# Patient Record
Sex: Male | Born: 1995 | Hispanic: Yes | Marital: Single | Smoking: Never smoker
Health system: Southern US, Community
[De-identification: ages and names within clinical notes are randomized; demographics above are authoritative.]

## PROBLEM LIST (undated history)

## (undated) HISTORY — PX: APPENDECTOMY: SHX54

---
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
## 2014-02-11 ENCOUNTER — Emergency Department: Payer: Self-pay | Admitting: Emergency Medicine

## 2015-04-01 ENCOUNTER — Emergency Department
Admission: EM | Admit: 2015-04-01 | Discharge: 2015-04-01 | Disposition: A | Discharge status: Home / Self Care | Attending: Emergency Medicine | Admitting: Emergency Medicine

## 2015-04-01 ENCOUNTER — Encounter: Payer: Self-pay | Admitting: Emergency Medicine

## 2015-04-01 DIAGNOSIS — J029 Acute pharyngitis, unspecified: Secondary | ICD-10-CM | POA: Insufficient documentation

## 2015-04-01 DIAGNOSIS — H9209 Otalgia, unspecified ear: Secondary | ICD-10-CM | POA: Diagnosis not present

## 2015-04-01 LAB — POCT RAPID STREP A: STREPTOCOCCUS, GROUP A SCREEN (DIRECT): NEGATIVE

## 2015-04-01 MED ORDER — KETOROLAC TROMETHAMINE 30 MG/ML IJ SOLN
INTRAMUSCULAR | Status: AC
  Administered 2015-04-01: 30 mg via INTRAMUSCULAR
  Filled 2015-04-01: qty 1

## 2015-04-01 MED ORDER — DEXAMETHASONE SODIUM PHOSPHATE 10 MG/ML IJ SOLN
10.0000 mg | Freq: Once | INTRAMUSCULAR | Status: AC
  Administered 2015-04-01: 10 mg via INTRAMUSCULAR
  Filled 2015-04-01: qty 1

## 2015-04-01 MED ORDER — KETOROLAC TROMETHAMINE 30 MG/ML IJ SOLN
30.0000 mg | Freq: Once | INTRAMUSCULAR | Status: AC
  Administered 2015-04-01: 30 mg via INTRAMUSCULAR

## 2015-04-01 MED ORDER — IBUPROFEN 600 MG PO TABS: 600.0000 mg | ORAL_TABLET | Freq: Three times a day (TID) | ORAL | Status: DC | PRN

## 2015-04-01 NOTE — ED Provider Notes (Signed)
[facility] Emergency Department Provider Note   ____________________________________________  Time seen: 1630  I have reviewed the triage vital signs and the nursing notes.   HISTORY  Chief Complaint Otalgia and Sore Throat   History limited by: Not Limited   HPI Justin Rodriguez is a 19 y.o. male who presents to the emergency department today because of concern for sore throat and ear pain. He states the symptoms have been present for the past 9 days. He states that they have been gradually worsening. The pain is worse with swallowing. He denies any difficulty with breathing. He states he has had a cough. Has been somewhat productive. He denies any chest pain or shortness breath. No fevers.   History reviewed. No pertinent past medical history.  There are no active problems to display for this patient.   History reviewed. No pertinent past surgical history.  Current Outpatient Rx  Name  Route  Sig  Dispense  Refill  . ibuprofen (ADVIL,MOTRIN) 600 MG tablet   Oral   Take 1 tablet (600 mg total) by mouth every 8 (eight) hours as needed.   20 tablet   0     Allergies Review of patient's allergies indicates no known allergies.  History reviewed. No pertinent family history.  Social History Social History  Substance Use Topics  . Smoking status: Never Smoker   . Smokeless tobacco: None  . Alcohol Use: No    Review of Systems  Constitutional: Negative for fever. Cardiovascular: Negative for chest pain. Respiratory: Negative for shortness of breath. Gastrointestinal: Negative for abdominal pain, vomiting and diarrhea. Neurological: Negative for headaches, focal weakness or numbness. 10-point ROS otherwise negative.  ____________________________________________   PHYSICAL EXAM:  VITAL SIGNS: ED Triage Vitals  Enc Vitals Group     BP 04/01/15 1604 131/89 mmHg     Pulse Rate 04/01/15 1604 99     Resp 04/01/15 1604 17     Temp  04/01/15 1604 98.1 F (36.7 C)     Temp Source 04/01/15 1604 Oral     SpO2 04/01/15 1604 100 %     Weight 04/01/15 1604 155 lb (70.308 kg)     Height 04/01/15 1604 6' (1.829 m)     Head Cir --      Peak Flow --      Pain Score 04/01/15 1605 7   Constitutional: Alert and oriented. Well appearing and in no distress. Eyes: Conjunctivae are normal. PERRL. Normal extraocular movements. ENT   Head: Normocephalic and atraumatic. TMs without any erythema, bulging or obvious fluid.   Nose: No congestion/rhinnorhea.   Mouth/Throat: Mucous membranes are moist. No pharyngeal exudate or erythema or swelling.   Neck: No stridor. Hematological/Lymphatic/Immunilogical: No cervical lymphadenopathy. Cardiovascular: Normal rate, regular rhythm.  No murmurs, rubs, or gallops. Respiratory: Normal respiratory effort without tachypnea nor retractions. Breath sounds are clear and equal bilaterally. No wheezes/rales/rhonchi. Gastrointestinal: Soft and nontender. No distention.  Genitourinary: Deferred Musculoskeletal: Normal range of motion in all extremities. No joint effusions.  No lower extremity tenderness nor edema. Neurologic:  Normal speech and language. No gross focal neurologic deficits are appreciated.  Skin:  Skin is warm, dry and intact. No rash noted. Psychiatric: Mood and affect are normal. Speech and behavior are normal. Patient exhibits appropriate insight and judgment.  ____________________________________________    LABS (pertinent positives/negatives)  Labs Reviewed  CULTURE, GROUP A STREP ([facility] ONLY)  POCT RAPID STREP A     ____________________________________________   EKG  None  ____________________________________________  RADIOLOGY  None  ____________________________________________   PROCEDURES  Procedure(s) performed: None  Critical Care performed: No  ____________________________________________   INITIAL IMPRESSION / ASSESSMENT AND PLAN  / ED COURSE  Pertinent labs & imaging results that were available during my care of the patient were reviewed by me and considered in my medical decision making (see chart for details).  Patient here because of concern for sore throat and ear pain. No concerning findings on physical exam. Given dose of toradol and steroids.  ____________________________________________   FINAL CLINICAL IMPRESSION(S) / ED DIAGNOSES  Final diagnoses:  Pharyngitis     Phineas Semen, MD 04/01/15 1659

## 2015-04-01 NOTE — ED Notes (Signed)
19 yom presents to ED by POV for ear pain and sore throat x8 days. Pain has been getting progressively worse. Patient has a productive cough and states he has been coughing up a dark yellow sputum. Patient has been taking dayqual and tylenol with no relief.

## 2015-04-01 NOTE — Discharge Instructions (Signed)
Please seek medical attention for any high fevers, chest pain, shortness of breath, change in behavior, persistent vomiting, bloody stool or any other new or concerning symptoms. ° ° °Pharyngitis °Pharyngitis is redness, pain, and swelling (inflammation) of your pharynx.  °CAUSES  °Pharyngitis is usually caused by infection. Most of the time, these infections are from viruses (viral) and are part of a cold. However, sometimes pharyngitis is caused by bacteria (bacterial). Pharyngitis can also be caused by allergies. Viral pharyngitis may be spread from person to person by coughing, sneezing, and personal items or utensils (cups, forks, spoons, toothbrushes). Bacterial pharyngitis may be spread from person to person by more intimate contact, such as kissing.  °SIGNS AND SYMPTOMS  °Symptoms of pharyngitis include:   °· Sore throat.   °· Tiredness (fatigue).   °· Low-grade fever.   °· Headache. °· Joint pain and muscle aches. °· Skin rashes. °· Swollen lymph nodes. °· Plaque-like film on throat or tonsils (often seen with bacterial pharyngitis). °DIAGNOSIS  °Your health care provider will ask you questions about your illness and your symptoms. Your medical history, along with a physical exam, is often all that is needed to diagnose pharyngitis. Sometimes, a rapid strep test is done. Other lab tests may also be done, depending on the suspected cause.  °TREATMENT  °Viral pharyngitis will usually get better in 3-4 days without the use of medicine. Bacterial pharyngitis is treated with medicines that kill germs (antibiotics).  °HOME CARE INSTRUCTIONS  °· Drink enough water and fluids to keep your urine clear or pale yellow.   °· Only take over-the-counter or prescription medicines as directed by your health care provider:   °¨ If you are prescribed antibiotics, make sure you finish them even if you start to feel better.   °¨ Do not take aspirin.   °· Get lots of rest.   °· Gargle with 8 oz of salt water (½ tsp of salt per  1 qt of water) as often as every 1-2 hours to soothe your throat.   °· Throat lozenges (if you are not at risk for choking) or sprays may be used to soothe your throat. °SEEK MEDICAL CARE IF:  °· You have large, tender lumps in your neck. °· You have a rash. °· You cough up green, yellow-brown, or bloody spit. °SEEK IMMEDIATE MEDICAL CARE IF:  °· Your neck becomes stiff. °· You drool or are unable to swallow liquids. °· You vomit or are unable to keep medicines or liquids down. °· You have severe pain that does not go away with the use of recommended medicines. °· You have trouble breathing (not caused by a stuffy nose). °MAKE SURE YOU:  °· Understand these instructions. °· Will watch your condition. °· Will get help right away if you are not doing well or get worse. °  °This information is not intended to replace advice given to you by your health care provider. Make sure you discuss any questions you have with your health care provider. °  °Document Released: 03/24/2005 Document Revised: 01/12/2013 Document Reviewed: 11/29/2012 °Elsevier Interactive Patient Education ©2016 [employer] ° °

## 2015-04-03 LAB — CULTURE, GROUP A STREP (THRC)

## 2019-05-17 ENCOUNTER — Encounter: Payer: Self-pay | Admitting: Emergency Medicine

## 2019-05-17 ENCOUNTER — Emergency Department

## 2019-05-17 ENCOUNTER — Observation Stay
Admission: EM | Admit: 2019-05-17 | Discharge: 2019-05-19 | Disposition: A | Discharge status: Home / Self Care | Attending: Surgery | Admitting: Surgery

## 2019-05-17 ENCOUNTER — Other Ambulatory Visit: Payer: Self-pay

## 2019-05-17 DIAGNOSIS — K353 Acute appendicitis with localized peritonitis, without perforation or gangrene: Secondary | ICD-10-CM

## 2019-05-17 DIAGNOSIS — K3580 Unspecified acute appendicitis: Secondary | ICD-10-CM | POA: Diagnosis present

## 2019-05-17 DIAGNOSIS — Z20822 Contact with and (suspected) exposure to covid-19: Secondary | ICD-10-CM | POA: Diagnosis not present

## 2019-05-17 LAB — CBC
HCT: 46.9 % (ref 39.0–52.0)
Hemoglobin: 16.8 g/dL (ref 13.0–17.0)
MCH: 31.3 pg (ref 26.0–34.0)
MCHC: 35.8 g/dL (ref 30.0–36.0)
MCV: 87.5 fL (ref 80.0–100.0)
Platelets: 319 10*3/uL (ref 150–400)
RBC: 5.36 MIL/uL (ref 4.22–5.81)
RDW: 11.9 % (ref 11.5–15.5)
WBC: 16.4 10*3/uL — ABNORMAL HIGH (ref 4.0–10.5)
nRBC: 0 % (ref 0.0–0.2)

## 2019-05-17 LAB — COMPREHENSIVE METABOLIC PANEL
ALT: 30 U/L (ref 0–44)
AST: 21 U/L (ref 15–41)
Albumin: 4.6 g/dL (ref 3.5–5.0)
Alkaline Phosphatase: 94 U/L (ref 38–126)
Anion gap: 10 (ref 5–15)
BUN: 8 mg/dL (ref 6–20)
CO2: 27 mmol/L (ref 22–32)
Calcium: 9.7 mg/dL (ref 8.9–10.3)
Chloride: 100 mmol/L (ref 98–111)
Creatinine, Ser: 0.9 mg/dL (ref 0.61–1.24)
GFR calc Af Amer: >60 mL/min (ref >60)
GFR calc non Af Amer: >60 mL/min (ref >60)
Glucose, Bld: 120 mg/dL — ABNORMAL HIGH (ref 70–99)
Potassium: 3.8 mmol/L (ref 3.5–5.1)
Sodium: 137 mmol/L (ref 135–145)
Total Bilirubin: 2 mg/dL — ABNORMAL HIGH (ref 0.3–1.2)
Total Protein: 7.8 g/dL (ref 6.5–8.1)

## 2019-05-17 LAB — URINALYSIS, COMPLETE (UACMP) WITH MICROSCOPIC
Bacteria, UA: NONE SEEN
Bilirubin Urine: NEGATIVE
Glucose, UA: NEGATIVE mg/dL
Hgb urine dipstick: NEGATIVE
Ketones, ur: 5 mg/dL — AB
Leukocytes,Ua: NEGATIVE
Nitrite: NEGATIVE
Protein, ur: NEGATIVE mg/dL
Specific Gravity, Urine: 1.008 (ref 1.005–1.030)
Squamous Epithelial / LPF: NONE SEEN (ref 0–5)
pH: 7 (ref 5.0–8.0)

## 2019-05-17 LAB — LIPASE, BLOOD: Lipase: 23 U/L (ref 11–51)

## 2019-05-17 MED ORDER — ONDANSETRON HCL 4 MG/2ML IJ SOLN
4.0000 mg | Freq: Once | INTRAMUSCULAR | Status: AC
  Administered 2019-05-17: 23:00:00 4 mg via INTRAVENOUS
  Filled 2019-05-17: qty 2

## 2019-05-17 MED ORDER — SODIUM CHLORIDE 0.9% FLUSH: 3.0000 mL | Freq: Once | INTRAVENOUS | Status: DC

## 2019-05-17 MED ORDER — MORPHINE SULFATE (PF) 4 MG/ML IV SOLN
4.0000 mg | Freq: Once | INTRAVENOUS | Status: AC
  Administered 2019-05-17: 23:00:00 4 mg via INTRAVENOUS
  Filled 2019-05-17: qty 1

## 2019-05-17 MED ORDER — IOHEXOL 300 MG/ML  SOLN
100.0000 mL | Freq: Once | INTRAMUSCULAR | Status: AC | PRN
  Administered 2019-05-17: 23:00:00 100 mL via INTRAVENOUS

## 2019-05-17 MED ORDER — IOHEXOL 9 MG/ML PO SOLN: 500.0000 mL | Freq: Once | ORAL | Status: DC | PRN

## 2019-05-17 MED ORDER — SODIUM CHLORIDE 0.9 % IV BOLUS
1000.0000 mL | Freq: Once | INTRAVENOUS | Status: AC
  Administered 2019-05-17: 23:00:00 1000 mL via INTRAVENOUS

## 2019-05-17 NOTE — ED Triage Notes (Signed)
Pt to ED from home c/o abd pain that started earlier today in epigastric and moved to RLQ that is burning, has had nausea and vomiting x3, denies fevers, denies eating making any change to pain.  States pain seems to radiate as well to back with some pain with urination.

## 2019-05-17 NOTE — ED Provider Notes (Signed)
[facility] Emergency Department Provider Note  ____________________________________________   First MD Initiated Contact with Patient 05/17/19 2314     (approximate)  I have reviewed the triage vital signs and the nursing notes.   HISTORY  Chief Complaint Abdominal Pain    HPI Justin Rodriguez is a 24 y.o. male with no history of chronic medical conditions presents to the emergency department secondary to acute onset of epigastric abdominal pain which began this morning.  Patient states that the pain is subsequently moved to the right lower quadrant with current pain score of 10 out of 10.  Patient also admits to associated nausea and vomiting.  Patient admits to chills however no fever.  Patient denies any diarrhea.  Patient denies any relationship with food.         History reviewed. No pertinent past medical history.  Patient Active Problem List   Diagnosis Date Noted  . Acute appendicitis 05/18/2019    History reviewed. No pertinent surgical history.  Prior to Admission medications   Not on File    Allergies Patient has no known allergies.  History reviewed. No pertinent family history.  Social History Social History   Tobacco Use  . Smoking status: Never Smoker  . Smokeless tobacco: Never Used  Substance Use Topics  . Alcohol use: No  . Drug use: No    Review of Systems Constitutional: No fever/chills Eyes: No visual changes. ENT: No sore throat. Cardiovascular: Denies chest pain. Respiratory: Denies shortness of breath. Gastrointestinal: Positive for abdominal pain nausea and vomiting no diarrhea.  No constipation. Genitourinary: Negative for dysuria. Musculoskeletal: Negative for neck pain.  Negative for back pain. Integumentary: Negative for rash. Neurological: Negative for headaches, focal weakness or numbness.  ____________________________________________   PHYSICAL EXAM:  VITAL SIGNS: ED Triage Vitals  Enc  Vitals Group     BP 05/17/19 2127 (!) 156/91     Pulse Rate 05/17/19 2127 90     Resp 05/17/19 2127 18     Temp 05/17/19 2127 98 F (36.7 C)     Temp Source 05/17/19 2127 Oral     SpO2 05/17/19 2127 100 %     Weight 05/17/19 2128 87.1 kg (192 lb)     Height 05/17/19 2128 1.803 m (5\' 11" )     Head Circumference --      Peak Flow --      Pain Score 05/17/19 2128 9     Pain Loc --      Pain Edu? --      Excl. in GC? --     Constitutional: Alert and oriented.  Apparent discomfort Eyes: Conjunctivae are normal.  Mouth/Throat: Patient is wearing a mask. Neck: No stridor.  No meningeal signs.   Cardiovascular: Normal rate, regular rhythm. Good peripheral circulation. Grossly normal heart sounds. Respiratory: Normal respiratory effort.  No retractions. Gastrointestinal: Positive for right lower quadrant abdominal pain with palpation, positive rebound voluntary guarding. Musculoskeletal: No lower extremity tenderness nor edema. No gross deformities of extremities. Neurologic:  Normal speech and language. No gross focal neurologic deficits are appreciated.  Skin:  Skin is warm, dry and intact. Psychiatric: Mood and affect are normal. Speech and behavior are normal.  ____________________________________________   LABS (all labs ordered are listed, but only abnormal results are displayed)  Labs Reviewed  COMPREHENSIVE METABOLIC PANEL - Abnormal; Notable for the following components:      Result Value   Glucose, Bld 120 (*)    Total Bilirubin 2.0 (*)  All other components within normal limits  CBC - Abnormal; Notable for the following components:   WBC 16.4 (*)    All other components within normal limits  URINALYSIS, COMPLETE (UACMP) WITH MICROSCOPIC - Abnormal; Notable for the following components:   Color, Urine YELLOW (*)    APPearance CLEAR (*)    Ketones, ur 5 (*)    All other components within normal limits  SARS CORONAVIRUS 2 (TAT 6-24 HRS)  LIPASE, BLOOD  HIV ANTIBODY  (ROUTINE TESTING W REFLEX)  BASIC METABOLIC PANEL  MAGNESIUM  CBC   _____________________________________  RADIOLOGY I, [place] N BROWN, personally viewed and evaluated these images (plain radiographs) as part of my medical decision making, as well as reviewing the written report by the radiologist.  ED MD interpretation: Dilated appendix with an appendicolith at the base consistent with acute appendicitis.  Official radiology report(s): CT ABDOMEN PELVIS W CONTRAST  Result Date: 05/17/2019 CLINICAL DATA:  Epigastric, right lower quadrant pain EXAM: CT ABDOMEN AND PELVIS WITH CONTRAST TECHNIQUE: Multidetector CT imaging of the abdomen and pelvis was performed using the standard protocol following bolus administration of intravenous contrast. CONTRAST:  174mL OMNIPAQUE IOHEXOL 300 MG/ML  SOLN COMPARISON:  None. FINDINGS: Lower chest: Calcified granuloma at the right lung base. Otherwise lung bases are clear. No effusions. Heart is normal size. Hepatobiliary: No focal hepatic abnormality. Gallbladder unremarkable. Pancreas: No focal abnormality or ductal dilatation. Spleen: No focal abnormality.  Normal size. Adrenals/Urinary Tract: No adrenal abnormality. No focal renal abnormality. No stones or hydronephrosis. Urinary bladder is unremarkable. Stomach/Bowel: Appendix is dilated and fluid-filled. Appendix measures up to 12 mm in diameter. Small appendicolith within the proximal appendix. Findings compatible with acute appendicitis. No evidence of perforation. Vascular/Lymphatic: No evidence of aneurysm or adenopathy. Reproductive: No visible focal abnormality. Other: No free fluid or free air. Musculoskeletal: No acute bony abnormality. IMPRESSION: Dilated appendix with appendicolith at the base. Findings compatible with acute appendicitis. Electronically Signed   By: Rolm Baptise M.D.   On: 05/17/2019 23:47    ____________________________________________    Procedures    ____________________________________________   INITIAL IMPRESSION / MDM / [place] / ED COURSE  As part of my medical decision making, I reviewed the following data within the electronic MEDICAL RECORD NUMBER   24 year old male presented with above-stated history and physical exam consistent acute appendicitis which was confirmed on CT.  Patient given Zosyn 3.375 mg and IV morphine 4 mg IV Zofran 4 mg.  Patient discussed with Dr. Hampton Abbot general surgeon on-call for hospital admission for further evaluation and management.  ____________________________________________  FINAL CLINICAL IMPRESSION(S) / ED DIAGNOSES  Final diagnoses:  Acute appendicitis with localized peritonitis, without perforation, abscess, or gangrene     MEDICATIONS GIVEN DURING THIS VISIT:  Medications  lactated ringers infusion (125 mL/hr Intravenous New Bag/Given 05/18/19 0228)  ketorolac (TORADOL) 30 MG/ML injection 30 mg (30 mg Intravenous Given 05/18/19 0225)  HYDROmorphone (DILAUDID) injection 0.5 mg (0.5 mg Intravenous Given 05/18/19 0226)  polyethylene glycol (MIRALAX / GLYCOLAX) packet 17 g (has no administration in time range)  ondansetron (ZOFRAN-ODT) disintegrating tablet 4 mg ( Oral See Alternative 05/18/19 0222)    Or  ondansetron (ZOFRAN) injection 4 mg (4 mg Intravenous Given 05/18/19 0222)  pantoprazole (PROTONIX) injection 40 mg (40 mg Intravenous Not Given 05/18/19 0247)  enoxaparin (LOVENOX) injection 40 mg (has no administration in time range)  piperacillin-tazobactam (ZOSYN) IVPB 3.375 g (has no administration in time range)  morphine 4 MG/ML injection 4 mg (  4 mg Intravenous Given 05/17/19 2311)  ondansetron (ZOFRAN) injection 4 mg (4 mg Intravenous Given 05/17/19 2305)  sodium chloride 0.9 % bolus 1,000 mL (0 mLs Intravenous Stopped 05/18/19 0136)  iohexol (OMNIPAQUE) 300 MG/ML solution 100 mL (100 mLs Intravenous Contrast Given 05/17/19 2325)  piperacillin-tazobactam (ZOSYN) IVPB 3.375 g (0 g  Intravenous Stopped 05/18/19 0035)  morphine 4 MG/ML injection 4 mg (4 mg Intravenous Given 05/18/19 0020)  ondansetron (ZOFRAN) injection 4 mg (4 mg Intravenous Given 05/18/19 0019)     ED Discharge Orders    None      *Please note:  Justin Rodriguez was evaluated in Emergency Department on 05/18/2019 for the symptoms described in the history of present illness. He was evaluated in the context of the global COVID-19 pandemic, which necessitated consideration that the patient might be at risk for infection with the SARS-CoV-2 virus that causes COVID-19. Institutional protocols and algorithms that pertain to the evaluation of patients at risk for COVID-19 are in a state of rapid change based on information released by regulatory bodies including the CDC and federal and state organizations. These policies and algorithms were followed during the patient's care in the ED.  Some ED evaluations and interventions may be delayed as a result of limited staffing during the pandemic.*  Note:  This document was prepared using Dragon voice recognition software and may include unintentional dictation errors.   Darci Current, MD 05/18/19 [phone]

## 2019-05-18 ENCOUNTER — Encounter: Payer: Self-pay | Admitting: Surgery

## 2019-05-18 ENCOUNTER — Encounter
Admission: EM | Disposition: A | Discharge status: Home / Self Care | Payer: Self-pay | Source: Home / Self Care | Attending: Emergency Medicine

## 2019-05-18 ENCOUNTER — Observation Stay: Admitting: Anesthesiology

## 2019-05-18 DIAGNOSIS — K3580 Unspecified acute appendicitis: Secondary | ICD-10-CM | POA: Diagnosis present

## 2019-05-18 DIAGNOSIS — K353 Acute appendicitis with localized peritonitis, without perforation or gangrene: Secondary | ICD-10-CM

## 2019-05-18 HISTORY — PX: LAPAROSCOPIC APPENDECTOMY: SHX408

## 2019-05-18 LAB — HIV ANTIBODY (ROUTINE TESTING W REFLEX): HIV Screen 4th Generation wRfx: NONREACTIVE

## 2019-05-18 LAB — CBC
HCT: 45.7 % (ref 39.0–52.0)
Hemoglobin: 16.5 g/dL (ref 13.0–17.0)
MCH: 31.5 pg (ref 26.0–34.0)
MCHC: 36.1 g/dL — ABNORMAL HIGH (ref 30.0–36.0)
MCV: 87.4 fL (ref 80.0–100.0)
Platelets: 297 10*3/uL (ref 150–400)
RBC: 5.23 MIL/uL (ref 4.22–5.81)
RDW: 11.9 % (ref 11.5–15.5)
WBC: 18.1 10*3/uL — ABNORMAL HIGH (ref 4.0–10.5)
nRBC: 0 % (ref 0.0–0.2)

## 2019-05-18 LAB — SARS CORONAVIRUS 2 (TAT 6-24 HRS): SARS Coronavirus 2: NEGATIVE

## 2019-05-18 LAB — BASIC METABOLIC PANEL
Anion gap: 10 (ref 5–15)
BUN: 8 mg/dL (ref 6–20)
CO2: 25 mmol/L (ref 22–32)
Calcium: 9.2 mg/dL (ref 8.9–10.3)
Chloride: 103 mmol/L (ref 98–111)
Creatinine, Ser: 0.95 mg/dL (ref 0.61–1.24)
GFR calc Af Amer: >60 mL/min (ref >60)
GFR calc non Af Amer: >60 mL/min (ref >60)
Glucose, Bld: 131 mg/dL — ABNORMAL HIGH (ref 70–99)
Potassium: 4.5 mmol/L (ref 3.5–5.1)
Sodium: 138 mmol/L (ref 135–145)

## 2019-05-18 LAB — SURGICAL PCR SCREEN
MRSA, PCR: NEGATIVE
Staphylococcus aureus: NEGATIVE

## 2019-05-18 LAB — MAGNESIUM: Magnesium: 2 mg/dL (ref 1.7–2.4)

## 2019-05-18 SURGERY — APPENDECTOMY, LAPAROSCOPIC
Anesthesia: General | Site: Abdomen

## 2019-05-18 MED ORDER — FENTANYL CITRATE (PF) 100 MCG/2ML IJ SOLN: 25.0000 ug | INTRAMUSCULAR | Status: DC | PRN

## 2019-05-18 MED ORDER — DEXAMETHASONE SODIUM PHOSPHATE 10 MG/ML IJ SOLN
INTRAMUSCULAR | Status: AC
  Filled 2019-05-18: qty 1

## 2019-05-18 MED ORDER — PROPOFOL 10 MG/ML IV BOLUS
INTRAVENOUS | Status: AC
  Filled 2019-05-18: qty 20

## 2019-05-18 MED ORDER — MORPHINE SULFATE (PF) 4 MG/ML IV SOLN
4.0000 mg | Freq: Once | INTRAVENOUS | Status: AC
  Administered 2019-05-18: 4 mg via INTRAVENOUS
  Filled 2019-05-18: qty 1

## 2019-05-18 MED ORDER — LACTATED RINGERS IV SOLN: INTRAVENOUS | Status: DC

## 2019-05-18 MED ORDER — HEPARIN SODIUM (PORCINE) 1000 UNIT/ML IJ SOLN
INTRAMUSCULAR | Status: AC
  Filled 2019-05-18: qty 1

## 2019-05-18 MED ORDER — ACETAMINOPHEN 10 MG/ML IV SOLN
INTRAVENOUS | Status: DC | PRN
  Administered 2019-05-18: 1000 mg via INTRAVENOUS

## 2019-05-18 MED ORDER — FENTANYL CITRATE (PF) 100 MCG/2ML IJ SOLN
INTRAMUSCULAR | Status: AC
  Filled 2019-05-18: qty 2

## 2019-05-18 MED ORDER — FENTANYL CITRATE (PF) 100 MCG/2ML IJ SOLN
INTRAMUSCULAR | Status: DC | PRN
  Administered 2019-05-18: 100 ug via INTRAVENOUS
  Administered 2019-05-18: 50 ug via INTRAVENOUS
  Administered 2019-05-18 (×2): 25 ug via INTRAVENOUS

## 2019-05-18 MED ORDER — POLYETHYLENE GLYCOL 3350 17 G PO PACK: 17.0000 g | PACK | Freq: Every day | ORAL | Status: DC | PRN

## 2019-05-18 MED ORDER — PIPERACILLIN-TAZOBACTAM 3.375 G IVPB 30 MIN
3.3750 g | Freq: Once | INTRAVENOUS | Status: AC
  Administered 2019-05-18: 3.375 g via INTRAVENOUS
  Filled 2019-05-18: qty 50

## 2019-05-18 MED ORDER — LACTATED RINGERS IV SOLN
125.0000 mL/h | INTRAVENOUS | Status: DC
  Administered 2019-05-18 (×2): 125 mL/h via INTRAVENOUS

## 2019-05-18 MED ORDER — LIDOCAINE HCL (CARDIAC) PF 100 MG/5ML IV SOSY
PREFILLED_SYRINGE | INTRAVENOUS | Status: DC | PRN
  Administered 2019-05-18: 100 mg via INTRAVENOUS

## 2019-05-18 MED ORDER — ONDANSETRON HCL 4 MG/2ML IJ SOLN: 4.0000 mg | Freq: Once | INTRAMUSCULAR | Status: DC | PRN

## 2019-05-18 MED ORDER — MUPIROCIN 2 % EX OINT
1.0000 "application " | TOPICAL_OINTMENT | Freq: Two times a day (BID) | CUTANEOUS | Status: DC
  Filled 2019-05-18: qty 22

## 2019-05-18 MED ORDER — ACETAMINOPHEN 10 MG/ML IV SOLN
INTRAVENOUS | Status: AC
  Filled 2019-05-18: qty 100

## 2019-05-18 MED ORDER — OXYCODONE HCL 5 MG PO TABS: 5.0000 mg | ORAL_TABLET | Freq: Once | ORAL | Status: DC | PRN

## 2019-05-18 MED ORDER — OXYCODONE HCL 5 MG PO TABS: 5.0000 mg | ORAL_TABLET | ORAL | Status: DC | PRN

## 2019-05-18 MED ORDER — BUPIVACAINE-EPINEPHRINE (PF) 0.5% -1:200000 IJ SOLN
INTRAMUSCULAR | Status: DC | PRN
  Administered 2019-05-18: 30 mL via PERINEURAL

## 2019-05-18 MED ORDER — ROCURONIUM BROMIDE 100 MG/10ML IV SOLN
INTRAVENOUS | Status: DC | PRN
  Administered 2019-05-18: 50 mg via INTRAVENOUS

## 2019-05-18 MED ORDER — MIDAZOLAM HCL 2 MG/2ML IJ SOLN
INTRAMUSCULAR | Status: AC
  Filled 2019-05-18: qty 2

## 2019-05-18 MED ORDER — PANTOPRAZOLE SODIUM 40 MG IV SOLR
40.0000 mg | Freq: Every day | INTRAVENOUS | Status: DC
  Administered 2019-05-18: 22:00:00 40 mg via INTRAVENOUS
  Filled 2019-05-18: qty 40

## 2019-05-18 MED ORDER — HYDROMORPHONE HCL 1 MG/ML IJ SOLN
0.5000 mg | INTRAMUSCULAR | Status: DC | PRN
  Administered 2019-05-18 (×2): 0.5 mg via INTRAVENOUS
  Filled 2019-05-18 (×2): qty 0.5

## 2019-05-18 MED ORDER — OXYCODONE HCL 5 MG/5ML PO SOLN: 5.0000 mg | Freq: Once | ORAL | Status: DC | PRN

## 2019-05-18 MED ORDER — MIDAZOLAM HCL 2 MG/2ML IJ SOLN
INTRAMUSCULAR | Status: DC | PRN
  Administered 2019-05-18: 2 mg via INTRAVENOUS

## 2019-05-18 MED ORDER — SUGAMMADEX SODIUM 200 MG/2ML IV SOLN
INTRAVENOUS | Status: AC
  Filled 2019-05-18: qty 2

## 2019-05-18 MED ORDER — ENOXAPARIN SODIUM 40 MG/0.4ML ~~LOC~~ SOLN
40.0000 mg | SUBCUTANEOUS | Status: DC
  Administered 2019-05-19: 40 mg via SUBCUTANEOUS
  Filled 2019-05-18: qty 0.4

## 2019-05-18 MED ORDER — ONDANSETRON HCL 4 MG/2ML IJ SOLN
4.0000 mg | Freq: Once | INTRAMUSCULAR | Status: AC
  Administered 2019-05-18: 4 mg via INTRAVENOUS
  Filled 2019-05-18: qty 2

## 2019-05-18 MED ORDER — DEXAMETHASONE SODIUM PHOSPHATE 10 MG/ML IJ SOLN
INTRAMUSCULAR | Status: DC | PRN
  Administered 2019-05-18: 10 mg via INTRAVENOUS

## 2019-05-18 MED ORDER — ONDANSETRON HCL 4 MG/2ML IJ SOLN
4.0000 mg | Freq: Four times a day (QID) | INTRAMUSCULAR | Status: DC | PRN
  Administered 2019-05-18 (×2): 4 mg via INTRAVENOUS
  Filled 2019-05-18 (×2): qty 2

## 2019-05-18 MED ORDER — KETOROLAC TROMETHAMINE 30 MG/ML IJ SOLN
30.0000 mg | Freq: Four times a day (QID) | INTRAMUSCULAR | Status: DC
  Administered 2019-05-18 – 2019-05-19 (×4): 30 mg via INTRAVENOUS
  Filled 2019-05-18 (×4): qty 1

## 2019-05-18 MED ORDER — ONDANSETRON 4 MG PO TBDP: 4.0000 mg | ORAL_TABLET | Freq: Four times a day (QID) | ORAL | Status: DC | PRN

## 2019-05-18 MED ORDER — SUGAMMADEX SODIUM 200 MG/2ML IV SOLN
INTRAVENOUS | Status: DC | PRN
  Administered 2019-05-18: 200 mg via INTRAVENOUS

## 2019-05-18 MED ORDER — ONDANSETRON HCL 4 MG/2ML IJ SOLN
INTRAMUSCULAR | Status: DC | PRN
  Administered 2019-05-18: 4 mg via INTRAVENOUS

## 2019-05-18 MED ORDER — ACETAMINOPHEN 10 MG/ML IV SOLN: 1000.0000 mg | Freq: Once | INTRAVENOUS | Status: DC | PRN

## 2019-05-18 MED ORDER — PROPOFOL 10 MG/ML IV BOLUS
INTRAVENOUS | Status: DC | PRN
  Administered 2019-05-18: 200 mg via INTRAVENOUS

## 2019-05-18 MED ORDER — PIPERACILLIN-TAZOBACTAM 3.375 G IVPB
3.3750 g | Freq: Three times a day (TID) | INTRAVENOUS | Status: DC
  Administered 2019-05-18 – 2019-05-19 (×4): 3.375 g via INTRAVENOUS
  Filled 2019-05-18 (×5): qty 50

## 2019-05-18 MED ORDER — SUCCINYLCHOLINE CHLORIDE 20 MG/ML IJ SOLN
INTRAMUSCULAR | Status: DC | PRN
  Administered 2019-05-18: 50 mg via INTRAVENOUS

## 2019-05-18 MED ORDER — DEXMEDETOMIDINE HCL 200 MCG/2ML IV SOLN
INTRAVENOUS | Status: DC | PRN
  Administered 2019-05-18: 8 ug via INTRAVENOUS

## 2019-05-18 MED ORDER — ACETAMINOPHEN 500 MG PO TABS: 1000.0000 mg | ORAL_TABLET | Freq: Four times a day (QID) | ORAL | Status: DC | PRN

## 2019-05-18 MED ORDER — ONDANSETRON HCL 4 MG/2ML IJ SOLN
INTRAMUSCULAR | Status: AC
  Filled 2019-05-18: qty 2

## 2019-05-18 SURGICAL SUPPLY — 42 items
CANISTER SUCT 1200ML W/VALVE (MISCELLANEOUS) ×2 IMPLANT
CHLORAPREP W/TINT 26 (MISCELLANEOUS) ×2 IMPLANT
COVER WAND RF STERILE (DRAPES) ×2 IMPLANT
CUTTER FLEX LINEAR 45M (STAPLE) ×2 IMPLANT
DERMABOND ADVANCED (GAUZE/BANDAGES/DRESSINGS) ×1
DERMABOND ADVANCED .7 DNX12 (GAUZE/BANDAGES/DRESSINGS) ×1 IMPLANT
ELECT CAUTERY BLADE 6.4 (BLADE) ×2 IMPLANT
ELECT REM PT RETURN 9FT ADLT (ELECTROSURGICAL) ×2
ELECTRODE REM PT RTRN 9FT ADLT (ELECTROSURGICAL) ×1 IMPLANT
GLOVE SURG SYN 7.0 (GLOVE) ×2 IMPLANT
GLOVE SURG SYN 7.5  E (GLOVE) ×1
GLOVE SURG SYN 7.5 E (GLOVE) ×1 IMPLANT
GOWN STRL REUS W/ TWL LRG LVL3 (GOWN DISPOSABLE) ×2 IMPLANT
GOWN STRL REUS W/TWL LRG LVL3 (GOWN DISPOSABLE) ×2
IRRIGATION STRYKERFLOW (MISCELLANEOUS) ×1 IMPLANT
IRRIGATOR STRYKERFLOW (MISCELLANEOUS) ×2
IV NS 1000ML (IV SOLUTION) ×1
IV NS 1000ML BAXH (IV SOLUTION) ×1 IMPLANT
KIT TURNOVER KIT A (KITS) ×2 IMPLANT
LABEL OR SOLS (LABEL) ×2 IMPLANT
LIGASURE LAP MARYLAND 5MM 37CM (ELECTROSURGICAL) ×2 IMPLANT
NEEDLE HYPO 22GX1.5 SAFETY (NEEDLE) ×2 IMPLANT
NS IRRIG 500ML POUR BTL (IV SOLUTION) ×2 IMPLANT
PACK LAP CHOLECYSTECTOMY (MISCELLANEOUS) ×2 IMPLANT
PENCIL ELECTRO HAND CTR (MISCELLANEOUS) ×2 IMPLANT
POUCH SPECIMEN RETRIEVAL 10MM (ENDOMECHANICALS) ×2 IMPLANT
RELOAD 45 VASCULAR/THIN (ENDOMECHANICALS) IMPLANT
RELOAD STAPLE TA45 3.5 REG BLU (ENDOMECHANICALS) ×4 IMPLANT
SCISSORS METZENBAUM CVD 33 (INSTRUMENTS) ×2 IMPLANT
SLEEVE ADV FIXATION 5X100MM (TROCAR) ×4 IMPLANT
SUT MNCRL 4-0 (SUTURE) ×1
SUT MNCRL 4-0 27XMFL (SUTURE) ×1
SUT VIC AB 3-0 SH 27 (SUTURE) ×1
SUT VIC AB 3-0 SH 27X BRD (SUTURE) ×1 IMPLANT
SUT VICRYL 0 AB UR-6 (SUTURE) ×2 IMPLANT
SUTURE MNCRL 4-0 27XMF (SUTURE) ×1 IMPLANT
SYS KII FIOS ACCESS ABD 5X100 (TROCAR) ×2
SYSTEM KII FIOS ACES ABD 5X100 (TROCAR) ×1 IMPLANT
TRAY FOLEY MTR SLVR 16FR STAT (SET/KITS/TRAYS/PACK) ×2 IMPLANT
TROCAR BALLN GELPORT 12X130M (ENDOMECHANICALS) ×2 IMPLANT
TROCAR Z-THREAD OPTICAL 5X100M (TROCAR) ×2 IMPLANT
TUBING EVAC SMOKE HEATED PNEUM (TUBING) ×2 IMPLANT

## 2019-05-18 NOTE — Op Note (Signed)
  Procedure Date:  05/18/2019  Pre-operative Diagnosis:  Acute appendicitis  Post-operative Diagnosis: Acute appendicitis  Procedure:  Laparoscopic appendectomy  Surgeon:  Howie Ill, MD  Anesthesia:  General endotracheal  Estimated Blood Loss:  5 ml  Specimens:  appendix  Complications:  None  Indications for Procedure:  This is a 24 y.o. male who presents with abdominal pain and workup revealing acute appendicitis.  The options of surgery versus observation were reviewed with the patient and/or family. The risks of bleeding, infection, recurrence of symptoms, negative laparoscopy, potential for an open procedure, bowel injury, abscess or infection, were all discussed with the patient and he was willing to proceed.  Description of Procedure: The patient was correctly identified in the preoperative area and brought into the operating room.  The patient was placed supine with VTE prophylaxis in place.  Appropriate time-outs were performed.  Anesthesia was induced and the patient was intubated.  Foley catheter was placed.  Appropriate antibiotics were infused.  The abdomen was prepped and draped in a sterile fashion. An infraumbilical incision was made. A cutdown technique was used to enter the abdominal cavity without injury, and a Hasson trocar was inserted.  Pneumoperitoneum was obtained with appropriate opening pressures.  Two 5-mm ports were placed in the suprapubic and left lateral positions under direct visualization.  The right lower quadrant was inspected and the appendix was identified and found to be acutely inflamed but not perforated.  There was ascitic fluid in the pelvis which was suctioned.  The appendix was carefully dissected.  The mesoappendix was divided using the LigaSure.  The base of the appendix was dissected out and divided with a standard load Endo GIA.  The appendix was placed in an Endocatch bag.  The right lower quadrant was then inspected again revealing an  intact staple line, no bleeding, and no bowel injury.  The area was thoroughly irrigated.  The 5 mm ports were removed under direct visualization and the Hasson trocar was removed.  The Endocatch bag was brought out through the umbilical incision.  The fascial opening was closed using 0 vicryl suture.  Local anesthetic was infused in all incisions and the incisions were closed with 4-0 Monocryl.  The wounds were cleaned and sealed with DermaBond.  Foley catheter was removed and the patient was emerged from anesthesia and extubated and brought to the recovery room for further management.  The patient tolerated the procedure well and all counts were correct at the end of the case.   Howie Ill, MD

## 2019-05-18 NOTE — H&P (Signed)
[facility] SURGICAL HISTORY & PHYSICAL (cpt [phone])  HISTORY OF PRESENT ILLNESS (HPI):  24 y.o. male presented to [facility] ED yesterday (05/17/2019) for abdominal pain. Patient reports the acute onset of centralized abdominal pain early in the day yesterday. The pain was a mild ache at first. However throughout the day the pain worsened, became severe and 10 out of 10, and migrated down to this RLQ. He tried antacids without relief. He endorsed associated decreased appetite, nausea, emesis, and chills with the pain. NO fever, cough, congestion, CP, SOB, or bladder/bowel changes. No history of similar pain. No previous abdominal surgeries. Work up in the ED was concerning for leukocytosis to 16K (which has increased to 18k this morning) and acute uncomplicated appendicitis on CT scan.   General surgery was consulted by emergency medicine physician Dr Marjean Donna, MD for evaluation and management of acute appendicitis.    PAST MEDICAL HISTORY (PMH):  History reviewed. No pertinent past medical history.  Reviewed. Otherwise negative.   PAST SURGICAL HISTORY ([place]):  History reviewed. No pertinent surgical history.  Reviewed. Otherwise negative.   MEDICATIONS:  Prior to Admission medications   Not on File     ALLERGIES:  No Known Allergies   SOCIAL HISTORY:  Social History   Socioeconomic History  . Marital status: Single    Spouse name: Not on file  . Number of children: Not on file  . Years of education: Not on file  . Highest education level: Not on file  Occupational History  . Not on file  Tobacco Use  . Smoking status: Never Smoker  . Smokeless tobacco: Never Used  Substance and Sexual Activity  . Alcohol use: No  . Drug use: No  . Sexual activity: Not on file  Other Topics Concern  . Not on file  Social History Narrative  . Not on file   Social Determinants of Health   Financial Resource Strain:   . Difficulty of Paying Living Expenses: Not on file   Food Insecurity:   . Worried About Charity fundraiser in the Last Year: Not on file  . Ran Out of Food in the Last Year: Not on file  Transportation Needs:   . Lack of Transportation (Medical): Not on file  . Lack of Transportation (Non-Medical): Not on file  Physical Activity:   . Days of Exercise per Week: Not on file  . Minutes of Exercise per Session: Not on file  Stress:   . Feeling of Stress : Not on file  Social Connections:   . Frequency of Communication with Friends and Family: Not on file  . Frequency of Social Gatherings with Friends and Family: Not on file  . Attends Religious Services: Not on file  . Active Member of Clubs or Organizations: Not on file  . Attends Archivist Meetings: Not on file  . Marital Status: Not on file  Intimate Partner Violence:   . Fear of Current or Ex-Partner: Not on file  . Emotionally Abused: Not on file  . Physically Abused: Not on file  . Sexually Abused: Not on file     FAMILY HISTORY:  History reviewed. No pertinent family history.  Otherwise negative.   REVIEW OF SYSTEMS:  Review of Systems  Constitutional: Positive for chills. Negative for fever.       + Decreased Appetite  HENT: Negative for congestion and sore throat.   Respiratory: Negative for cough and shortness of breath.   Cardiovascular: Negative for chest  pain and palpitations.  Gastrointestinal: Positive for abdominal pain, nausea and vomiting. Negative for blood in stool, constipation and diarrhea.  Genitourinary: Negative for dysuria and urgency.  All other systems reviewed and are negative.   VITAL SIGNS:  Temp:  [98 F (36.7 C)-99.5 F (37.5 C)] 98.3 F (36.8 C) (02/10 0507) Pulse Rate:  [85-97] 86 (02/10 0507) Resp:  [16-18] 18 (02/10 0507) BP: (118-156)/(72-96) 118/72 (02/10 0507) SpO2:  [98 %-100 %] 99 % (02/10 0507) Weight:  [87.1 kg-91.2 kg] 91.2 kg (02/10 0208)     Height: 5\' 11"  (180.3 cm) Weight: 91.2 kg BMI (Calculated): 28.05    PHYSICAL EXAM:  Physical Exam Vitals and nursing note reviewed.  Constitutional:      General: He is not in acute distress.    Appearance: He is well-developed and normal weight. He is not ill-appearing.  HENT:     Head: Normocephalic.  Eyes:     General: No scleral icterus.    Extraocular Movements: Extraocular movements intact.  Cardiovascular:     Rate and Rhythm: Normal rate and regular rhythm.     Heart sounds: Normal heart sounds. No murmur.  Pulmonary:     Effort: Pulmonary effort is normal. No respiratory distress.     Breath sounds: Normal breath sounds. No wheezing.  Abdominal:     General: Abdomen is flat. There is no distension.     Palpations: Abdomen is soft.     Tenderness: There is abdominal tenderness in the right lower quadrant and suprapubic area. There is no guarding or rebound. Positive signs include Rovsing's sign and McBurney's sign.  Genitourinary:    Comments: Deferred Skin:    General: Skin is warm and dry.     Coloration: Skin is not jaundiced or pale.  Neurological:     General: No focal deficit present.     Mental Status: He is alert and oriented to person, place, and time.  Psychiatric:        Mood and Affect: Mood normal.        Behavior: Behavior normal.     INTAKE/OUTPUT:  This shift: No intake/output data recorded.  Last 2 shifts: @IOLAST2SHIFTS @  Labs:  CBC Latest Ref Rng & Units 05/18/2019 05/17/2019  WBC 4.0 - 10.5 K/uL 18.1(H) 16.4(H)  Hemoglobin 13.0 - 17.0 g/dL 07/16/2019 07/15/2019  Hematocrit 17.7 - 52.0 % 45.7 46.9  Platelets 150 - 400 K/uL 297 319   CMP Latest Ref Rng & Units 05/18/2019 05/17/2019  Glucose 70 - 99 mg/dL 07/16/2019) 07/15/2019)  BUN 6 - 20 mg/dL 8 8  Creatinine 092(Z - 1.24 mg/dL 300(T 6.22  Sodium 6.33 - 145 mmol/L 138 137  Potassium 3.5 - 5.1 mmol/L 4.5 3.8  Chloride 98 - 111 mmol/L 103 100  CO2 22 - 32 mmol/L 25 27  Calcium 8.9 - 10.3 mg/dL 9.2 9.7  Total Protein 6.5 - 8.1 g/dL - 7.8  Total Bilirubin 0.3 - 1.2 mg/dL - 2.0(H)   Alkaline Phos 38 - 126 U/L - 94  AST 15 - 41 U/L - 21  ALT 0 - 44 U/L - 30     Imaging studies:   CT Abdomen/Pelvis (05/18/2019) personally reviewed which shows dilated appendix with appendicolith without obvious perforation or abscess, and radiologist report reviewed:  IMPRESSION: Dilated appendix with appendicolith at the base. Findings compatible with acute appendicitis.    Assessment/Plan: (ICD-10's: K35.80) 24 y.o. male with leukocytosis and RLQ abdominal pain concerning for acute uncomplicated appendicitis.    - Admit  to general surgery   - Plan for laparoscopic appendectomy this afternoon with Dr Aleen Campi pending OR/Anesthesia availability  - All risks, benefits, and alternatives to above procedure(s) were discussed with the patient, all of his questions were answered to his expressed satisfaction, patient expresses he wishes to proceed, and informed consent was obtained.   - NPO + IVF resuscitation  - IV ABx (Zosyn)   - Pain control prn; antiemetics prn  - monitor abdominal examination   - DVT prophylaxis; hold for OR  All of the above findings and recommendations were discussed with the patient, and all of his questions were answered to his expressed satisfaction.  -- Lynden Oxford, PA-C [facility] 05/18/2019, 7:59 AM [phone] M-F: 7am - 4pm

## 2019-05-18 NOTE — Transfer of Care (Signed)
Immediate Anesthesia Transfer of Care Note  Patient: Justin Rodriguez  Procedure(s) Performed: APPENDECTOMY LAPAROSCOPIC (N/A Abdomen)  Patient Location: PACU  Anesthesia Type:General  Level of Consciousness: awake  Airway & Oxygen Therapy: Patient connected to face mask oxygen  Post-op Assessment: Post -op Vital signs reviewed and stable  Post vital signs: stable  Last Vitals:  Vitals Value Taken Time  BP 93/47 05/18/19 1754  Temp    Pulse 87 05/18/19 1755  Resp 12 05/18/19 1755  SpO2 99 % 05/18/19 1755  Vitals shown include unvalidated device data.  Last Pain:  Vitals:   05/18/19 1533  TempSrc: Tympanic  PainSc: 1          Complications: No apparent anesthesia complications

## 2019-05-18 NOTE — Anesthesia Procedure Notes (Signed)
Procedure Name: Intubation Date/Time: 05/18/2019 4:21 PM Performed by: Aline Brochure, CRNA Pre-anesthesia Checklist: Patient identified, Emergency Drugs available, Suction available and Patient being monitored Patient Re-evaluated:Patient Re-evaluated prior to induction Oxygen Delivery Method: Circle system utilized Preoxygenation: Pre-oxygenation with 100% oxygen Induction Type: IV induction and Rapid sequence Laryngoscope Size: Mac and 4 Grade View: Grade I Tube type: Oral Tube size: 7.5 mm Number of attempts: 1 Airway Equipment and Method: Stylet Placement Confirmation: ETT inserted through vocal cords under direct vision,  positive ETCO2 and breath sounds checked- equal and bilateral Secured at: 23 cm Tube secured with: Tape Dental Injury: Teeth and Oropharynx as per pre-operative assessment

## 2019-05-18 NOTE — Anesthesia Preprocedure Evaluation (Signed)
Anesthesia Evaluation  Patient identified by MRN, date of birth, ID band Patient awake    Reviewed: Allergy & Precautions, NPO status , Patient's Chart, lab work & pertinent test results  History of Anesthesia Complications Negative for: history of anesthetic complications  Airway Mallampati: II  TM Distance: >3 FB Neck ROM: Full    Dental no notable dental hx. (+) Teeth Intact, Dental Advisory Given   Pulmonary neg pulmonary ROS, neg sleep apnea, neg COPD, Patient abstained from smoking.Not current smoker,    Pulmonary exam normal breath sounds clear to auscultation       Cardiovascular Exercise Tolerance: Good METS(-) hypertension(-) CAD and (-) Past MI negative cardio ROS  (-) dysrhythmias  Rhythm:Regular Rate:Normal - Systolic murmurs    Neuro/Psych negative neurological ROS  negative psych ROS   GI/Hepatic neg GERD  ,(+)     (-) substance abuse  ,   Endo/Other  neg diabetes  Renal/GU negative Renal ROS     Musculoskeletal   Abdominal   Peds  Hematology   Anesthesia Other Findings History reviewed. No pertinent past medical history.  Reproductive/Obstetrics                             Anesthesia Physical Anesthesia Plan  ASA: I  Anesthesia Plan: General   Post-op Pain Management:    Induction: Intravenous, Rapid sequence and Cricoid pressure planned  PONV Risk Score and Plan: 3 and Ondansetron, Dexamethasone and Midazolam  Airway Management Planned: Oral ETT  Additional Equipment: None  Intra-op Plan:   Post-operative Plan: Extubation in OR  Informed Consent: I have reviewed the patients History and Physical, chart, labs and discussed the procedure including the risks, benefits and alternatives for the proposed anesthesia with the patient or authorized representative who has indicated his/her understanding and acceptance.     Dental advisory given  Plan Discussed  with: CRNA and Surgeon  Anesthesia Plan Comments: (Discussed risks of anesthesia with patient, including PONV, sore throat, lip/dental damage. Rare risks discussed as well, such as cardiorespiratory sequelae. Patient understands.)        Anesthesia Quick Evaluation

## 2019-05-18 NOTE — Plan of Care (Signed)

## 2019-05-18 NOTE — ED Notes (Signed)
This RN assisted pt with ambulation to bathroom, pt ambulated independently and with steady gait.

## 2019-05-18 NOTE — ED Notes (Signed)
Pt states that his pain and nausea is continuously increasing. MD Manson Passey notified.

## 2019-05-18 NOTE — Anesthesia Postprocedure Evaluation (Signed)
Anesthesia Post Note  Patient: Justin Rodriguez  Procedure(s) Performed: APPENDECTOMY LAPAROSCOPIC (N/A Abdomen)  Patient location during evaluation: PACU Anesthesia Type: General Level of consciousness: awake and alert Pain management: pain level controlled Vital Signs Assessment: post-procedure vital signs reviewed and stable Respiratory status: spontaneous breathing, nonlabored ventilation, respiratory function stable and patient connected to nasal cannula oxygen Cardiovascular status: blood pressure returned to baseline and stable Postop Assessment: no apparent nausea or vomiting Anesthetic complications: no     Last Vitals:  Vitals:   05/18/19 1757 05/18/19 1811  BP: (!) 93/47 (!) 104/56  Pulse: 88 84  Resp: 14 14  Temp: (!) 36.3 C   SpO2: 99% 99%    Last Pain:  Vitals:   05/18/19 1811  TempSrc:   PainSc: Asleep                 Corinda Gubler

## 2019-05-19 MED ORDER — AMOXICILLIN-POT CLAVULANATE 875-125 MG PO TABS: 1.0000 | ORAL_TABLET | Freq: Two times a day (BID) | ORAL | 0 refills | Status: AC

## 2019-05-19 MED ORDER — OXYCODONE HCL 5 MG PO TABS: 5.0000 mg | ORAL_TABLET | Freq: Four times a day (QID) | ORAL | 0 refills | Status: AC | PRN

## 2019-05-19 MED ORDER — IBUPROFEN 800 MG PO TABS: 800.0000 mg | ORAL_TABLET | Freq: Three times a day (TID) | ORAL | 0 refills | Status: DC | PRN

## 2019-05-19 NOTE — Plan of Care (Signed)
Discharge order received. Patient mental status is at baseline. Vital signs stable . No signs of acute distress. Discharge instructions given. Patient verbalized understanding. No other issues noted at this time.   

## 2019-05-19 NOTE — Discharge Instructions (Signed)
In addition to included general post-operative instructions for laparoscopic appendectomy,  Diet: Resume home diet.   Activity: No heavy lifting >20 pounds (children, pets, laundry, garbage) for 4 weeks, but light activity and walking are encouraged. Do not drive or drink alcohol if taking narcotic pain medications or having pain that might distract from driving.  Wound care: 2 days after surgery (02/12), you may shower/get incision wet with soapy water and pat dry (do not rub incisions), but no baths or submerging incision underwater until follow-up.   Medications: Resume all home medications. For mild to moderate pain: acetaminophen (Tylenol) or ibuprofen/naproxen (if no kidney disease). Combining Tylenol with alcohol can substantially increase your risk of causing liver disease. Narcotic pain medications, if prescribed, can be used for severe pain, though may cause nausea, constipation, and drowsiness. Do not combine Tylenol and Percocet (or similar) within a 6 hour period as Percocet (and similar) contain(s) Tylenol. If you do not need the narcotic pain medication, you do not need to fill the prescription.  Call office [phone] / [phone]) at any time if any questions, worsening pain, fevers/chills, bleeding, drainage from incision site, or other concerns.

## 2019-05-19 NOTE — Discharge Summary (Signed)
[facility] SURGICAL DISCHARGE SUMMARY   Patient ID: Justin Rodriguez MRN: [id number] DOB/AGE: January 22, 1996 24 y.o.  Admit date: 05/17/2019 Discharge date: 05/19/2019  Discharge Diagnoses Patient Active Problem List   Diagnosis Date Noted  . Acute appendicitis 05/18/2019    Consultants None  Procedures 05/18/2019:  Laparoscopic Appendectomy   HPI: 24 y.o. male presented to [facility] ED yesterday (05/17/2019) for abdominal pain. Patient reports the acute onset of centralized abdominal pain early in the day yesterday. The pain was a mild ache at first. However throughout the day the pain worsened, became severe and 10 out of 10, and migrated down to this RLQ. He tried antacids without relief. He endorsed associated decreased appetite, nausea, emesis, and chills with the pain. NO fever, cough, congestion, CP, SOB, or bladder/bowel changes. No history of similar pain. No previous abdominal surgeries. Work up in the ED was concerning for leukocytosis to 16K (which has increased to 18k this morning) and acute uncomplicated appendicitis on CT scan.    Hospital Course: Informed consent was obtained and documented, and patient underwent uneventful laparoscopic appendectomy (Dr Aleen Campi, 05/18/2019).  Post-operatively, patient's pain improved/resolved and advancement of patient's diet and ambulation were well-tolerated. The remainder of patient's hospital course was essentially unremarkable, and discharge planning was initiated accordingly with patient safely able to be discharged home with appropriate discharge instructions, antibiotics (Augmentin x7 days), pain control, and outpatient follow-up after all of his questions were answered to his expressed satisfaction.   Discharge Condition: Good   Physical Examination:  Constitutional: Well appearing male, NAD Pulmonary: Normal effort, no respiratory distress Gastrointestinal: Soft, incisional soreness, no rebound/guarding Skin:  Laparoscopic incisions are CDI with dermabond, no erythema or drainage    Allergies as of 05/19/2019   No Known Allergies     Medication List    TAKE these medications   amoxicillin-clavulanate 875-125 MG tablet Commonly known as: Augmentin Take 1 tablet by mouth 2 (two) times daily for 7 days.   ibuprofen 800 MG tablet Commonly known as: ADVIL Take 1 tablet (800 mg total) by mouth every 8 (eight) hours as needed.   oxyCODONE 5 MG immediate release tablet Commonly known as: Oxy IR/ROXICODONE Take 1 tablet (5 mg total) by mouth every 6 (six) hours as needed for severe pain or breakthrough pain.        Follow-up Information    Donovan Kail, PA-C. Schedule an appointment as soon as possible for a visit in 2 week(s).   Specialty: Physician Assistant Why: s/p lap appy Contact information: [address] [place] [address] [phone]            Time spent on discharge management including discussion of hospital course, clinical condition, outpatient instructions, prescriptions, and follow up with the patient and members of the medical team: >30 minutes  -- Lynden Oxford , PA-C [facility]  05/19/2019, 9:17 AM [phone] M-F: 7am - 4pm

## 2019-05-20 LAB — SURGICAL PATHOLOGY

## 2019-05-23 ENCOUNTER — Telehealth: Payer: Self-pay | Admitting: Surgery

## 2019-05-23 NOTE — Telephone Encounter (Signed)
Patient called concerned that he had a lap appendectomy w/Dr. Aleen Campi on 05/18/19, however he was not sure when he should plan on returning to work.  He is aware of his post op appt w/Zack, PA on 05/31/19, but his work was asking. Please advise by calling back to discuss further.  Thank you

## 2019-05-23 NOTE — Telephone Encounter (Signed)
Note provided to patient for his employer stating his return to work date from having Appendectomy 05/18/19.

## 2019-05-31 ENCOUNTER — Other Ambulatory Visit: Payer: Self-pay

## 2019-05-31 ENCOUNTER — Encounter: Payer: Self-pay | Admitting: Physician Assistant

## 2019-05-31 ENCOUNTER — Ambulatory Visit (INDEPENDENT_AMBULATORY_CARE_PROVIDER_SITE_OTHER): Payer: Self-pay | Admitting: Physician Assistant

## 2019-05-31 VITALS — BP 125/81 | HR 87 | Temp 96.6°F | Resp 14 | Ht 71.0 in | Wt 201.4 lb

## 2019-05-31 DIAGNOSIS — K3580 Unspecified acute appendicitis: Secondary | ICD-10-CM

## 2019-05-31 DIAGNOSIS — Z09 Encounter for follow-up examination after completed treatment for conditions other than malignant neoplasm: Secondary | ICD-10-CM

## 2019-05-31 NOTE — Progress Notes (Signed)
[facility] POST-OP OFFICE VISIT  05/31/2019  HPI: Justin Rodriguez is a 24 y.o. male 13 days s/p laparoscopic appendectomy with Dr Aleen Campi  Today, he reports that he is doing well. He has intermittent twinges of pain which he rates a 1/10. Not taking any pain medications. No fever, chills, nausea, or emesis. Tolerating PO. Normal bowel function. No additional complaints.    Vital signs: BP 125/81   Pulse 87   Temp (!) 96.6 F (35.9 C) (Temporal)   Resp 14   Ht 5\' 11"  (1.803 m)   Wt 201 lb 6.4 oz (91.4 kg)   SpO2 97%   BMI 28.09 kg/m    Physical Exam: Constitutional: Well appearing male, NAD Abdomen: Soft, non-tender, non-distended, no rebound/guarding Skin: Laparoscopic incisions are well healed, no erythema or drainage  Assessment/Plan: This is a 24 y.o. male 13 days s/p laparoscopic appendectomy   - Reviewed wound care instructions  - 2 more weeks of lifting restrictions; provided work note  - reviewed pathology: Acute appendicitis with transmural inflammation and serositis   - rtc prn   -- 22, PA-C [facility] 05/31/2019, 1:41 PM [phone] M-F: 7am - 4pm

## 2019-05-31 NOTE — Patient Instructions (Addendum)
Zach informed patient that he may remove the glue from the incision sites. Patient is to refrain from any heavy lifting for two more weeks. Patient may take Tylenol or Ibuprofen for pain management. If patient has any other questions or concerns, please give our office a call.  Follow-up with our office as needed. Please call and ask to speak with a nurse if you develop questions or concerns.   GENERAL POST-OPERATIVE PATIENT INSTRUCTIONS   FOLLOW-UP:  Please make an appointment with your physician in.  Call your physician immediately if you have any fevers greater than 102.5, drainage from you wound that is not clear or looks infected, persistent bleeding, increasing abdominal pain, problems urinating, or persistent nausea/vomiting.    WOUND CARE INSTRUCTIONS:  Keep a dry clean dressing on the wound if there is drainage. The initial bandage may be removed after 24 hours.  Once the wound has quit draining you may leave it open to air.  If clothing rubs against the wound or causes irritation and the wound is not draining you may cover it with a dry dressing during the daytime.  Try to keep the wound dry and avoid ointments on the wound unless directed to do so.  If the wound becomes bright red and painful or starts to drain infected material that is not clear, please contact your physician immediately.  If the wound is mildly pink and has a thick firm ridge underneath it, this is normal, and is referred to as a healing ridge.  This will resolve over the next 4-6 weeks.  DIET:  You may eat any foods that you can tolerate.  It is a good idea to eat a high fiber diet and take in plenty of fluids to prevent constipation.  If you do become constipated you may want to take a mild laxative or take ducolax tablets on a daily basis until your bowel habits are regular.  Constipation can be very uncomfortable, along with straining, after recent surgery.  ACTIVITY:  You are encouraged to cough and deep breath or use  your incentive spirometer if you were given one, every 15-30 minutes when awake.  This will help prevent respiratory complications and low grade fevers post-operatively if you had a general anesthetic.  You may want to hug a pillow when coughing and sneezing to add additional support to the surgical area, if you had abdominal or chest surgery, which will decrease pain during these times.  You are encouraged to walk and engage in light activity for the next two weeks.  You should not lift more than 20 pounds during this time frame as it could put you at increased risk for complications.  Twenty pounds is roughly equivalent to a plastic bag of groceries.    MEDICATIONS:  Try to take narcotic medications and anti-inflammatory medications, such as tylenol, ibuprofen, naprosyn, etc., with food.  This will minimize stomach upset from the medication.  Should you develop nausea and vomiting from the pain medication, or develop a rash, please discontinue the medication and contact your physician.  You should not drive, make important decisions, or operate machinery when taking narcotic pain medication.  QUESTIONS:  Please feel free to call your physician or the hospital operator if you have any questions, and they will be glad to assist you.

## 2019-06-06 ENCOUNTER — Telehealth: Payer: Self-pay | Admitting: *Deleted

## 2019-06-06 NOTE — Telephone Encounter (Signed)
Faxed FMLA to [phone]

## 2019-08-04 ENCOUNTER — Emergency Department

## 2019-08-04 ENCOUNTER — Other Ambulatory Visit: Payer: Self-pay

## 2019-08-04 ENCOUNTER — Emergency Department
Admission: EM | Admit: 2019-08-04 | Discharge: 2019-08-04 | Disposition: A | Discharge status: Home / Self Care | Attending: Emergency Medicine | Admitting: Emergency Medicine

## 2019-08-04 DIAGNOSIS — S29019A Strain of muscle and tendon of unspecified wall of thorax, initial encounter: Secondary | ICD-10-CM | POA: Diagnosis not present

## 2019-08-04 DIAGNOSIS — Y999 Unspecified external cause status: Secondary | ICD-10-CM | POA: Insufficient documentation

## 2019-08-04 DIAGNOSIS — Y9389 Activity, other specified: Secondary | ICD-10-CM | POA: Diagnosis not present

## 2019-08-04 DIAGNOSIS — S161XXA Strain of muscle, fascia and tendon at neck level, initial encounter: Secondary | ICD-10-CM | POA: Diagnosis not present

## 2019-08-04 DIAGNOSIS — S52571A Other intraarticular fracture of lower end of right radius, initial encounter for closed fracture: Secondary | ICD-10-CM | POA: Diagnosis not present

## 2019-08-04 DIAGNOSIS — Y92410 Unspecified street and highway as the place of occurrence of the external cause: Secondary | ICD-10-CM | POA: Diagnosis not present

## 2019-08-04 DIAGNOSIS — S6991XA Unspecified injury of right wrist, hand and finger(s), initial encounter: Secondary | ICD-10-CM | POA: Diagnosis present

## 2019-08-04 MED ORDER — IBUPROFEN 800 MG PO TABS
800.0000 mg | ORAL_TABLET | Freq: Once | ORAL | Status: AC
  Administered 2019-08-04: 23:00:00 800 mg via ORAL
  Filled 2019-08-04: qty 1

## 2019-08-04 MED ORDER — TRAMADOL HCL 50 MG PO TABS
50.0000 mg | ORAL_TABLET | Freq: Once | ORAL | Status: AC
  Administered 2019-08-04: 50 mg via ORAL
  Filled 2019-08-04: qty 1

## 2019-08-04 MED ORDER — IBUPROFEN 600 MG PO TABS: 600.0000 mg | ORAL_TABLET | Freq: Four times a day (QID) | ORAL | 0 refills | Status: AC | PRN

## 2019-08-04 MED ORDER — CYCLOBENZAPRINE HCL 5 MG PO TABS: 5.0000 mg | ORAL_TABLET | Freq: Three times a day (TID) | ORAL | 0 refills | Status: AC | PRN

## 2019-08-04 MED ORDER — TRAMADOL HCL 50 MG PO TABS: 50.0000 mg | ORAL_TABLET | Freq: Four times a day (QID) | ORAL | 0 refills | Status: AC | PRN
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
## 2019-08-04 NOTE — Discharge Instructions (Signed)
Please wear splint on right upper extremity at all times, keep clean and dry.  You may take Tylenol and ibuprofen for mild to moderate pain.  For severe pain take tramadol.  Please take Flexeril as needed for muscle spasms and tightness.  Call orthopedic office tomorrow to schedule follow-up appointment.

## 2020-05-04 ENCOUNTER — Ambulatory Visit (INDEPENDENT_AMBULATORY_CARE_PROVIDER_SITE_OTHER): Admitting: Podiatry

## 2020-05-04 ENCOUNTER — Other Ambulatory Visit: Payer: Self-pay

## 2020-05-04 ENCOUNTER — Other Ambulatory Visit: Payer: Self-pay | Admitting: Podiatry

## 2020-05-04 DIAGNOSIS — Z79899 Other long term (current) drug therapy: Secondary | ICD-10-CM

## 2020-05-04 DIAGNOSIS — B351 Tinea unguium: Secondary | ICD-10-CM

## 2020-05-05 LAB — HEPATIC FUNCTION PANEL
ALT: 26 IU/L (ref 0–44)
AST: 19 IU/L (ref 0–40)
Albumin: 4.9 g/dL (ref 4.1–5.2)
Alkaline Phosphatase: 114 IU/L (ref 44–121)
Bilirubin Total: 1.2 mg/dL (ref 0.0–1.2)
Bilirubin, Direct: 0.22 mg/dL (ref 0.00–0.40)
Total Protein: 7.5 g/dL (ref 6.0–8.5)

## 2020-05-08 ENCOUNTER — Encounter: Payer: Self-pay | Admitting: Podiatry

## 2020-05-08 ENCOUNTER — Other Ambulatory Visit: Payer: Self-pay | Admitting: Podiatry

## 2020-05-08 MED ORDER — TERBINAFINE HCL 250 MG PO TABS: 250.0000 mg | ORAL_TABLET | Freq: Every day | ORAL | 0 refills | Status: AC

## 2020-05-08 NOTE — Progress Notes (Signed)
Subjective:  Patient ID: Justin Rodriguez, male    DOB: 1995/07/17,  MRN: [id number]  Chief Complaint  Patient presents with  . Nail Problem    Bilateral nail fungus  Pt stated that it has been going on for four years    25 y.o. male presents with the above complaint.  Thickened elongated dystrophic toenails x10.  Painful to touch.  He has been going on for few years last 4 years.  He states that he has not tried really anything for it.  He would like to now discuss removing the nail fungus.  He has not tried any over-the-counter medication or any prescription pills.  He has not seen anyone else prior to seeing me.  He denies any other acute complaints.   Review of Systems: Negative except as noted in the HPI. Denies N/V/F/Ch.  No past medical history on file.  Current Outpatient Medications:  .  cyclobenzaprine (FLEXERIL) 5 MG tablet, Take 1-2 tablets (5-10 mg total) by mouth 3 (three) times daily as needed for muscle spasms., Disp: 20 tablet, Rfl: 0 .  ibuprofen (ADVIL) 600 MG tablet, Take 1 tablet (600 mg total) by mouth every 6 (six) hours as needed for moderate pain., Disp: 30 tablet, Rfl: 0 .  oxyCODONE (OXY IR/ROXICODONE) 5 MG immediate release tablet, Take 1 tablet (5 mg total) by mouth every 6 (six) hours as needed for severe pain or breakthrough pain. (Patient not taking: Reported on 05/31/2019), Disp: 20 tablet, Rfl: 0 .  traMADol (ULTRAM) 50 MG tablet, Take 1 tablet (50 mg total) by mouth every 6 (six) hours as needed., Disp: 20 tablet, Rfl: 0  Social History   Tobacco Use  Smoking Status Never Smoker  Smokeless Tobacco Never Used    No Known Allergies Objective:  There were no vitals filed for this visit. There is no height or weight on file to calculate BMI. Constitutional Well developed. Well nourished.  Vascular Dorsalis pedis pulses palpable bilaterally. Posterior tibial pulses palpable bilaterally. Capillary refill normal to all digits.  No cyanosis or clubbing  noted. Pedal hair growth normal.  Neurologic Normal speech. Oriented to person, place, and time. Epicritic sensation to light touch grossly present bilaterally.  Dermatologic  thickened elongated mycotic dystrophic toenails x10.  No pain on palpation.  Orthopedic: Normal joint ROM without pain or crepitus bilaterally. No visible deformities. No bony tenderness.   Radiographs: None Assessment:   1. Encounter for long-term (current) use of high-risk medication   2. Onychomycosis due to dermatophyte   3. Nail fungus    Plan:  Patient was evaluated and treated and all questions answered.  Bilateral onychomycosis x10 -Educated the patient on the etiology of onychomycosis and various treatment options associated with improving the fungal load.  I explained to the patient that there is 3 treatment options available to treat the onychomycosis including topical, p.o., laser treatment.  Patient elected to undergo p.o. options with Lamisil/terbinafine therapy.  In order for me to start the medication therapy, I explained to the patient the importance of evaluating the liver and obtaining the liver function test.  Once the liver function test comes back normal I will start him on 54-month course of Lamisil therapy.  Patient understood all risk and would like to proceed with Lamisil therapy.  I have asked the patient to immediately stop the Lamisil therapy if she has any reactions to it and call the office or go to the emergency room right away.  Patient states understanding   No  follow-ups on file.

## 2020-08-21 ENCOUNTER — Other Ambulatory Visit: Payer: Self-pay | Admitting: Podiatry

## 2020-08-24 NOTE — Telephone Encounter (Signed)
Please advise 

## 2020-08-26 ENCOUNTER — Ambulatory Visit
Admission: RE | Admit: 2020-08-26 | Discharge: 2020-08-26 | Disposition: A | Discharge status: Home / Self Care | Source: Ambulatory Visit | Attending: Emergency Medicine | Admitting: Emergency Medicine

## 2020-08-26 ENCOUNTER — Other Ambulatory Visit: Payer: Self-pay

## 2020-08-26 VITALS — BP 120/77 | HR 85 | Temp 98.7°F | Resp 16 | Ht 71.0 in | Wt 196.0 lb

## 2020-08-26 DIAGNOSIS — R053 Chronic cough: Secondary | ICD-10-CM | POA: Diagnosis not present

## 2020-08-26 DIAGNOSIS — J069 Acute upper respiratory infection, unspecified: Secondary | ICD-10-CM

## 2020-08-26 DIAGNOSIS — U099 Post covid-19 condition, unspecified: Secondary | ICD-10-CM

## 2020-08-26 MED ORDER — IPRATROPIUM BROMIDE 0.06 % NA SOLN: 2.0000 | Freq: Four times a day (QID) | NASAL | 12 refills | Status: AC

## 2020-08-26 MED ORDER — PROMETHAZINE-DM 6.25-15 MG/5ML PO SYRP: 5.0000 mL | ORAL_SOLUTION | Freq: Four times a day (QID) | ORAL | 0 refills | Status: AC | PRN

## 2020-08-26 MED ORDER — BENZONATATE 100 MG PO CAPS: 200.0000 mg | ORAL_CAPSULE | Freq: Three times a day (TID) | ORAL | 0 refills | Status: AC

## 2020-08-26 NOTE — ED Provider Notes (Signed)
[facility]    CSN: [id number] Arrival date & time: 08/26/20  1055      History   Chief Complaint Chief Complaint  Patient presents with  . Cough    APPOINTMENT    HPI Justin Rodriguez is a 25 y.o. male.   HPI   25 year old male here for evaluation of continued cough and chest congestion.  Patient reports that he be tested positive for COVID 2 to 3 weeks ago and is continued to have chest congestion and and a cough.  He has not had any fever, wheezing, itchy eyes, or ear pain.  He does endorse some mild shortness of breath and runny nose.  History reviewed. No pertinent past medical history.  Patient Active Problem List   Diagnosis Date Noted  . Acute appendicitis 05/18/2019    Past Surgical History:  Procedure Laterality Date  . APPENDECTOMY    . LAPAROSCOPIC APPENDECTOMY N/A 05/18/2019   Procedure: APPENDECTOMY LAPAROSCOPIC;  Surgeon: Henrene Dodge, MD;  Location: [facility] ORS;  Service: General;  Laterality: N/A;       Home Medications    Prior to Admission medications   Medication Sig Start Date End Date Taking? Authorizing Provider  benzonatate (TESSALON) 100 MG capsule Take 2 capsules (200 mg total) by mouth every 8 (eight) hours. 08/26/20  Yes Becky Augusta, NP  ipratropium (ATROVENT) 0.06 % nasal spray Place 2 sprays into both nostrils 4 (four) times daily. 08/26/20  Yes Becky Augusta, NP  promethazine-dextromethorphan (PROMETHAZINE-DM) 6.25-15 MG/5ML syrup Take 5 mLs by mouth 4 (four) times daily as needed. 08/26/20  Yes Becky Augusta, NP  terbinafine (LAMISIL) 250 MG tablet Take 1 tablet (250 mg total) by mouth daily. 05/08/20  Yes Candelaria Stagers, DPM  cyclobenzaprine (FLEXERIL) 5 MG tablet Take 1-2 tablets (5-10 mg total) by mouth 3 (three) times daily as needed for muscle spasms. 08/04/19   Evon Slack, PA-C  ibuprofen (ADVIL) 600 MG tablet Take 1 tablet (600 mg total) by mouth every 6 (six) hours as needed for moderate pain. 08/04/19   Evon Slack, PA-C  oxyCODONE (OXY IR/ROXICODONE) 5 MG immediate release tablet Take 1 tablet (5 mg total) by mouth every 6 (six) hours as needed for severe pain or breakthrough pain. Patient not taking: Reported on 05/31/2019 05/19/19   Donovan Kail, PA-C    Family History History reviewed. No pertinent family history.  Social History Social History   Tobacco Use  . Smoking status: Never Smoker  . Smokeless tobacco: Never Used  Vaping Use  . Vaping Use: Never used  Substance Use Topics  . Alcohol use: No  . Drug use: No     Allergies   Patient has no known allergies.   Review of Systems Review of Systems  Constitutional: Negative for activity change, appetite change and fever.  HENT: Positive for congestion and rhinorrhea. Negative for ear pain and sore throat.   Eyes: Negative for itching.  Respiratory: Positive for cough and shortness of breath. Negative for wheezing.   Gastrointestinal: Negative for diarrhea, nausea and vomiting.  Skin: Negative for rash.  Hematological: Negative.   Psychiatric/Behavioral: Negative.      Physical Exam Triage Vital Signs ED Triage Vitals  Enc Vitals Group     BP 08/26/20 1113 120/77     Pulse Rate 08/26/20 1113 85     Resp 08/26/20 1113 16     Temp 08/26/20 1113 98.7 F (37.1 C)     Temp Source 08/26/20 1113  Oral     SpO2 08/26/20 1113 98 %     Weight 08/26/20 1110 196 lb (88.9 kg)     Height 08/26/20 1110 5\' 11"  (1.803 m)     Head Circumference --      Peak Flow --      Pain Score 08/26/20 1110 0     Pain Loc --      Pain Edu? --      Excl. in GC? --    No data found.  Updated Vital Signs BP 120/77 (BP Location: Left Arm)   Pulse 85   Temp 98.7 F (37.1 C) (Oral)   Resp 16   Ht 5\' 11"  (1.803 m)   Wt 196 lb (88.9 kg)   SpO2 98%   BMI 27.34 kg/m   Visual Acuity Right Eye Distance:   Left Eye Distance:   Bilateral Distance:    Right Eye Near:   Left Eye Near:    Bilateral Near:     Physical Exam Vitals and  nursing note reviewed.  Constitutional:      General: He is not in acute distress.    Appearance: Normal appearance. He is normal weight. He is not ill-appearing.  HENT:     Head: Normocephalic and atraumatic.     Right Ear: Tympanic membrane, ear canal and external ear normal. There is no impacted cerumen.     Left Ear: Tympanic membrane, ear canal and external ear normal. There is no impacted cerumen.     Nose: Congestion and rhinorrhea present.     Mouth/Throat:     Mouth: Mucous membranes are moist.     Pharynx: Oropharynx is clear. Posterior oropharyngeal erythema present.  Cardiovascular:     Rate and Rhythm: Normal rate and regular rhythm.     Pulses: Normal pulses.     Heart sounds: Normal heart sounds. No murmur heard. No gallop.   Pulmonary:     Effort: Pulmonary effort is normal.     Breath sounds: No wheezing or rales.  Musculoskeletal:     Cervical back: Normal range of motion and neck supple.  Lymphadenopathy:     Cervical: No cervical adenopathy.  Skin:    General: Skin is warm and dry.     Capillary Refill: Capillary refill takes less than 2 seconds.     Findings: No erythema or rash.  Neurological:     General: No focal deficit present.     Mental Status: He is alert and oriented to person, place, and time.  Psychiatric:        Mood and Affect: Mood normal.        Behavior: Behavior normal.        Thought Content: Thought content normal.        Judgment: Judgment normal.      UC Treatments / Results  Labs (all labs ordered are listed, but only abnormal results are displayed) Labs Reviewed - No data to display  EKG   Radiology No results found.  Procedures Procedures (including critical care time)  Medications Ordered in UC Medications - No data to display  Initial Impression / Assessment and Plan / UC Course  I have reviewed the triage vital signs and the nursing notes.  Pertinent labs & imaging results that were available during my care of  the patient were reviewed by me and considered in my medical decision making (see chart for details).   Is a very pleasant and nontoxic-appearing 25 year old male here for evaluation  of ongoing cough and chest congestion since being diagnosed with COVID 2 to 3 weeks ago.  He reports ongoing mild shortness of breath but he is able to speak in full sentences, and a mild runny nose.  His cough is nonproductive.  He has not had any wheezing, or ear pain.  Patient's physical exam reveals pearly gray tympanic membranes bilaterally with a normal light reflex and clear external auditory canals.  Nasal mucosa is erythematous and edematous with clear nasal discharge.  Oropharyngeal exam reveals posterior oropharyngeal erythema and cobblestoning with clear postnasal drip.  No cervical lymphadenopathy appreciated exam.  Cardiopulmonary exam is benign.  Patient symptoms are consistent with a viral URI is unclear if this is continuing from previous COVID infection or if this is a new presentation.  Will treat with Atrovent nasal spray, Tessalon Perles, and Promethazine DM cough syrup.  Patient vies if his symptoms continue 6 weeks or longer he needs to talk to his primary care doctor about being referred to the COVID long-haul clinic.   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 Provider   ipratropium (ATROVENT) 0.06 % nasal spray Place 2 sprays into both nostrils 4 (four) times daily. 15 mL Becky Augusta, NP   benzonatate (TESSALON) 100 MG capsule Take 2 capsules (200 mg total) by mouth every 8 (eight) hours. 21 capsule Becky Augusta, NP   promethazine-dextromethorphan (PROMETHAZINE-DM) 6.25-15 MG/5ML syrup Take 5 mLs by mouth 4 (four) times daily as needed. 118 mL Becky Augusta, NP     PDMP not reviewed this encounter.   Becky Augusta, NP 08/26/20 1144

## 2020-08-26 NOTE — ED Triage Notes (Signed)
Patient states that she tested positive for COVID 2-3 weeks ago.  Patient states that he has been having ongoing cough and chest congestion Patient denies fevers.

## 2020-08-26 NOTE — Discharge Instructions (Addendum)

## 2020-08-27 ENCOUNTER — Telehealth: Payer: Self-pay | Admitting: Podiatry

## 2020-08-27 NOTE — Telephone Encounter (Signed)
Patient called and requested a refill on the Lamisil. Please advise   Please call [phone]

## 2020-08-29 NOTE — Telephone Encounter (Signed)
He will need to schedule an appointment to be seen again prior to me refilling the Lamisil as I will need to do another liver function test.

## 2020-09-04 ENCOUNTER — Encounter: Payer: Self-pay | Admitting: Podiatry

## 2020-09-04 ENCOUNTER — Other Ambulatory Visit: Payer: Self-pay

## 2020-09-04 ENCOUNTER — Ambulatory Visit (INDEPENDENT_AMBULATORY_CARE_PROVIDER_SITE_OTHER): Admitting: Podiatry

## 2020-09-04 DIAGNOSIS — B351 Tinea unguium: Secondary | ICD-10-CM | POA: Diagnosis not present

## 2020-09-04 DIAGNOSIS — Z79899 Other long term (current) drug therapy: Secondary | ICD-10-CM | POA: Diagnosis not present

## 2020-09-04 NOTE — Progress Notes (Signed)
  Subjective:  Patient ID: Justin Rodriguez, male    DOB: 29-Aug-1995,  MRN: [id number]  Chief Complaint  Patient presents with  . Nail Problem    Doing well,  he has noticed improvement in his nails    25 y.o. male presents with the above complaint.  Thickened elongated dystrophic toenails x10.  Painful to touch.  He states that they have been improved considerably with 71-month course of Lamisil therapy.  He denies any other acute complaints and would like to know if he can do more.   Review of Systems: Negative except as noted in the HPI. Denies N/V/F/Ch.  No past medical history on file.  Current Outpatient Medications:  .  benzonatate (TESSALON) 100 MG capsule, Take 2 capsules (200 mg total) by mouth every 8 (eight) hours., Disp: 21 capsule, Rfl: 0 .  cyclobenzaprine (FLEXERIL) 5 MG tablet, Take 1-2 tablets (5-10 mg total) by mouth 3 (three) times daily as needed for muscle spasms., Disp: 20 tablet, Rfl: 0 .  ibuprofen (ADVIL) 600 MG tablet, Take 1 tablet (600 mg total) by mouth every 6 (six) hours as needed for moderate pain., Disp: 30 tablet, Rfl: 0 .  ipratropium (ATROVENT) 0.06 % nasal spray, Place 2 sprays into both nostrils 4 (four) times daily., Disp: 15 mL, Rfl: 12 .  oxyCODONE (OXY IR/ROXICODONE) 5 MG immediate release tablet, Take 1 tablet (5 mg total) by mouth every 6 (six) hours as needed for severe pain or breakthrough pain. (Patient not taking: Reported on 05/31/2019), Disp: 20 tablet, Rfl: 0 .  promethazine-dextromethorphan (PROMETHAZINE-DM) 6.25-15 MG/5ML syrup, Take 5 mLs by mouth 4 (four) times daily as needed., Disp: 118 mL, Rfl: 0 .  terbinafine (LAMISIL) 250 MG tablet, Take 1 tablet (250 mg total) by mouth daily., Disp: 90 tablet, Rfl: 0  Social History   Tobacco Use  Smoking Status Never Smoker  Smokeless Tobacco Never Used    No Known Allergies Objective:  There were no vitals filed for this visit. There is no height or weight on file to calculate  BMI. Constitutional Well developed. Well nourished.  Vascular Dorsalis pedis pulses palpable bilaterally. Posterior tibial pulses palpable bilaterally. Capillary refill normal to all digits.  No cyanosis or clubbing noted. Pedal hair growth normal.  Neurologic Normal speech. Oriented to person, place, and time. Epicritic sensation to light touch grossly present bilaterally.  Dermatologic  thickened elongated mycotic dystrophic toenails x10 improving.  No pain on palpation.  Orthopedic: Normal joint ROM without pain or crepitus bilaterally. No visible deformities. No bony tenderness.   Radiographs: None Assessment:   1. Encounter for long-term (current) use of high-risk medication   2. Onychomycosis due to dermatophyte   3. Nail fungus    Plan:  Patient was evaluated and treated and all questions answered.  Bilateral onychomycosis x10~second round course -Educated the patient on the etiology of onychomycosis and various treatment options associated with improving the fungal load.  I explained to the patient that there is 3 treatment options available to treat the onychomycosis including topical, p.o., laser treatment.  Patient has elected to undergo Lamisil/terbinafine therapy for the second round.  The nails are improving.  Another liver function test was sent.  I discussed the risk and complication with the medication in extensive detail.   No follow-ups on file.

## 2020-09-05 ENCOUNTER — Telehealth: Payer: Self-pay | Admitting: Podiatry

## 2020-09-05 LAB — HEPATIC FUNCTION PANEL
ALT: 48 IU/L — ABNORMAL HIGH (ref 0–44)
AST: 19 IU/L (ref 0–40)
Albumin: 4.7 g/dL (ref 4.1–5.2)
Alkaline Phosphatase: 119 IU/L (ref 44–121)
Bilirubin Total: 0.8 mg/dL (ref 0.0–1.2)
Bilirubin, Direct: 0.16 mg/dL (ref 0.00–0.40)
Total Protein: 7.5 g/dL (ref 6.0–8.5)

## 2020-09-05 NOTE — Telephone Encounter (Signed)
Please call patient back [phone]

## 2021-01-17 ENCOUNTER — Ambulatory Visit (INDEPENDENT_AMBULATORY_CARE_PROVIDER_SITE_OTHER): Admitting: Podiatry

## 2021-01-17 ENCOUNTER — Other Ambulatory Visit: Payer: Self-pay

## 2021-01-17 DIAGNOSIS — B351 Tinea unguium: Secondary | ICD-10-CM

## 2021-01-17 DIAGNOSIS — Z79899 Other long term (current) drug therapy: Secondary | ICD-10-CM | POA: Diagnosis not present

## 2021-01-22 NOTE — Progress Notes (Signed)
  Subjective:  Patient ID: Justin Rodriguez, male    DOB: 1995-04-28,  MRN: [id number]  Chief Complaint  Patient presents with   Nail Problem    Fungus follow up     25 y.o. male presents with the above complaint.  Thickened elongated dystrophic toenails x10.  He states is looking a lot better.  He has completed 2 rounds of Lamisil therapy without any complication.  He denies any other acute complaints.   Review of Systems: Negative except as noted in the HPI. Denies N/V/F/Ch.  No past medical history on file.  Current Outpatient Medications:    benzonatate (TESSALON) 100 MG capsule, Take 2 capsules (200 mg total) by mouth every 8 (eight) hours., Disp: 21 capsule, Rfl: 0   cyclobenzaprine (FLEXERIL) 5 MG tablet, Take 1-2 tablets (5-10 mg total) by mouth 3 (three) times daily as needed for muscle spasms., Disp: 20 tablet, Rfl: 0   ibuprofen (ADVIL) 600 MG tablet, Take 1 tablet (600 mg total) by mouth every 6 (six) hours as needed for moderate pain., Disp: 30 tablet, Rfl: 0   ipratropium (ATROVENT) 0.06 % nasal spray, Place 2 sprays into both nostrils 4 (four) times daily., Disp: 15 mL, Rfl: 12   oxyCODONE (OXY IR/ROXICODONE) 5 MG immediate release tablet, Take 1 tablet (5 mg total) by mouth every 6 (six) hours as needed for severe pain or breakthrough pain. (Patient not taking: Reported on 05/31/2019), Disp: 20 tablet, Rfl: 0   promethazine-dextromethorphan (PROMETHAZINE-DM) 6.25-15 MG/5ML syrup, Take 5 mLs by mouth 4 (four) times daily as needed., Disp: 118 mL, Rfl: 0   terbinafine (LAMISIL) 250 MG tablet, Take 1 tablet (250 mg total) by mouth daily., Disp: 90 tablet, Rfl: 0  Social History   Tobacco Use  Smoking Status Never  Smokeless Tobacco Never    No Known Allergies Objective:  There were no vitals filed for this visit. There is no height or weight on file to calculate BMI. Constitutional Well developed. Well nourished.  Vascular Dorsalis pedis pulses palpable  bilaterally. Posterior tibial pulses palpable bilaterally. Capillary refill normal to all digits.  No cyanosis or clubbing noted. Pedal hair growth normal.  Neurologic Normal speech. Oriented to person, place, and time. Epicritic sensation to light touch grossly present bilaterally.  Dermatologic Much improving thickened elongated mycotic dystrophic toenails x10 improving.  No pain on palpation.  Orthopedic: Normal joint ROM without pain or crepitus bilaterally. No visible deformities. No bony tenderness.   Radiographs: None Assessment:   1. Onychomycosis due to dermatophyte   2. Encounter for long-term (current) use of high-risk medication   3. Nail fungus     Plan:  Patient was evaluated and treated and all questions answered.  Bilateral onychomycosis x10~second round course -Clinically doing a lot better with Lamisil/terbinafine-.  He has completed 2 rounds.  At this time I discussed with him to continue clinical monitoring if it if there is any regression or worsening come see me right away.  He states understanding   No follow-ups on file.
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
## 2022-06-17 ENCOUNTER — Ambulatory Visit: Admitting: Podiatry

## 2022-06-17 DIAGNOSIS — L600 Ingrowing nail: Secondary | ICD-10-CM | POA: Diagnosis not present

## 2022-06-17 NOTE — Progress Notes (Addendum)
Subjective:  Patient ID: Justin Rodriguez, male    DOB: [birth year],  MRN: [id number]  Chief Complaint  Patient presents with   Ingrown Toenail    27 y.o. male presents with the above complaint.  Patient presents with left hallux lateral border ingrown painful to touch is progressive gotten worse worse with ambulation worse with pressure would like to have removed he has not seen anyone as prior to seeing me denies any other acute complaints pain scale 7 out of 10 dull aching nature   Review of Systems: Negative except as noted in the HPI. Denies N/V/F/Ch.  No past medical history on file.  Current Outpatient Medications:    benzonatate (TESSALON) 100 MG capsule, Take 2 capsules (200 mg total) by mouth every 8 (eight) hours., Disp: 21 capsule, Rfl: 0   cyclobenzaprine (FLEXERIL) 5 MG tablet, Take 1-2 tablets (5-10 mg total) by mouth 3 (three) times daily as needed for muscle spasms., Disp: 20 tablet, Rfl: 0   ibuprofen (ADVIL) 600 MG tablet, Take 1 tablet (600 mg total) by mouth every 6 (six) hours as needed for moderate pain., Disp: 30 tablet, Rfl: 0   ipratropium (ATROVENT) 0.06 % nasal spray, Place 2 sprays into both nostrils 4 (four) times daily., Disp: 15 mL, Rfl: 12   oxyCODONE (OXY IR/ROXICODONE) 5 MG immediate release tablet, Take 1 tablet (5 mg total) by mouth every 6 (six) hours as needed for severe pain or breakthrough pain. (Patient not taking: Reported on 05/31/2019), Disp: 20 tablet, Rfl: 0   promethazine-dextromethorphan (PROMETHAZINE-DM) 6.25-15 MG/5ML syrup, Take 5 mLs by mouth 4 (four) times daily as needed., Disp: 118 mL, Rfl: 0   terbinafine (LAMISIL) 250 MG tablet, Take 1 tablet (250 mg total) by mouth daily., Disp: 90 tablet, Rfl: 0  Social History   Tobacco Use  Smoking Status Never  Smokeless Tobacco Never    No Known Allergies Objective:  There were no vitals filed for this visit. There is no height or weight on file to calculate BMI. Constitutional Well  developed. Well nourished.  Vascular Dorsalis pedis pulses palpable bilaterally. Posterior tibial pulses palpable bilaterally. Capillary refill normal to all digits.  No cyanosis or clubbing noted. Pedal hair growth normal.  Neurologic Normal speech. Oriented to person, place, and time. Epicritic sensation to light touch grossly present bilaterally.  Dermatologic Painful ingrowing nail at lateral nail borders of the hallux nail left. No other open wounds. No skin lesions.  Orthopedic: Normal joint ROM without pain or crepitus bilaterally. No visible deformities. No bony tenderness.   Radiographs: None Assessment:   1. Ingrown left big toenail    Plan:  Patient was evaluated and treated and all questions answered.  Ingrown Nail, left -Patient elects to proceed with minor surgery to remove ingrown toenail removal today. Consent reviewed and signed by patient. -Ingrown nail excised. See procedure note. -Educated on post-procedure care including soaking. Written instructions provided and reviewed. -Patient to follow up in 2 weeks for nail check.  Procedure: Excision of Ingrown Toenail Location: Left 1st toe lateral nail borders. Anesthesia: Lidocaine 1% plain; 1.5 mL and Marcaine 0.5% plain; 1.5 mL, digital block. Skin Prep: Betadine. Dressing: Silvadene; telfa; dry, sterile, compression dressing. Technique: Following skin prep, the toe was exsanguinated and a tourniquet was secured at the base of the toe. The affected nail border was freed, split with a nail splitter, and excised. Chemical matrixectomy was then performed with phenol and irrigated out with alcohol. The tourniquet was then removed and sterile dressing  applied. Disposition: Patient tolerated procedure well. Patient to return in 2 weeks for follow-up.   No follow-ups on file.
